# Patient Record
Sex: Female | Born: 2000 | Race: White | Hispanic: No | Marital: Single | Smoking: Never smoker
Health system: Southern US, Community
[De-identification: ages and names within clinical notes are randomized; demographics above are authoritative.]

## PROBLEM LIST (undated history)

## (undated) DIAGNOSIS — K5190 Ulcerative colitis, unspecified, without complications: Secondary | ICD-10-CM

## (undated) DIAGNOSIS — K529 Noninfective gastroenteritis and colitis, unspecified: Secondary | ICD-10-CM

## (undated) HISTORY — PX: UPPER GASTROINTESTINAL ENDOSCOPY: SHX188

## (undated) HISTORY — DX: Ulcerative colitis, unspecified, without complications: K51.90

## (undated) HISTORY — PX: COLONOSCOPY: SHX174

---
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
## 2023-11-03 ENCOUNTER — Telehealth: Payer: Self-pay | Admitting: Gastroenterology

## 2023-11-03 NOTE — Telephone Encounter (Signed)
 Unfortunately, I do not have any release on file indicating that I am able to speak with the patient's mother about her care. I have, however, attempted to reach patient at the phone number available in her chart. I get no answer and no voicemail is available. I will attempt to reach out again at a later time.

## 2023-11-03 NOTE — Telephone Encounter (Signed)
 Patient mother called and stated that she was having an ulcerative colitis flare up and was needing a appointment as soon as possible. Patient mother stated that her daughter was a patient of Dr. Doy Hutching over at [facility]. I advised patient that we dont do emergency appointments here and the soonest we have for her would be for May the 7 th. Patient mother stated that she would like Dr. Doy Hutching to please return her call either at [phone] or [phone]. Please advise.

## 2023-11-04 NOTE — Telephone Encounter (Signed)
 Again, attempted to reach Dana Graham by phone. No answer, no voicemail available.

## 2023-11-04 NOTE — Telephone Encounter (Signed)
 Spoke to Dana Graham. She states that she was a previous patient with Dr Doy Hutching over at [facility] (last seen 2022). Hx colitis.   She indicates that over the last 2 weeks, she has experienced diarrhea. Originally thought she had eaten some undercooked pork but after continued symptoms, saw PCP. Stool studies were completed and she was told everything was negative (CDiff, fecal leukocytes). Continues to have diarrhea upwards of 8+ times daily at this point. Denies any blood in stool. Does have nausea but no vomiting, +epigastric and periumbilical abdominal tenderness, no fevers. She states she did take some imodium today and was able to go to work but is aware that imodium may not be good for her to take. She does not currently take any medications for IBD. Patient requesting expedited appointment. First available appointment with extender is 11/13/23; first available [facility] appointment is 12/02/23)  Dr Doy Hutching, please advise.Marland KitchenMarland Kitchen

## 2023-11-05 NOTE — Telephone Encounter (Signed)
 I have spoken to Dana Graham and offered appointment with Dr Doy Hutching on 11/13/23 at 340 pm. Patient is in agreement with this. Appointment has been scheduled and patient advised to arrive at 320 pm for registration/paperwork.

## 2023-11-12 NOTE — Progress Notes (Addendum)
 [facility] Return Visit   Referring Provider No referring provider defined for this encounter.  Primary Care Provider Lynwood Dawley, Dana Graham  Patient Profile: Dana Graham is a 23 y.o. female who returns to the [facility] for follow-up of the problem(s) noted below.  Problem List: 1. Pan ulcerative colitis diagnosed 2014 2. History of C. Diff colitis 04/2014 3. History of anemia requiring iron infusion  4. 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 Diarrhea, fecal urgency, bloating   History of Present Illness   Dana Graham was previously followed by me at [facility].  She was last seen for colonoscopy 07/03/2022 Last clinic visit at [facility] was 12/26/2020   Current Graham Meds  Probiotic  Interval History  -- Dana Graham's last colonoscopy in 2023 showed Mayo 1 proctitis and Mayo 0 disease in the remainder of the colon; polyps removed were TA and inflammatory polyp -- Advised use of probiotic and Canasa suppositories  -- Dana Graham presents today to establish care at Dana Graham reporting a recent change in symptoms that she thinks is related to food she ate rather than UC flare -- Reports consuming [place] food 3 weeks ago and thought there was something wrong with pork Gyoza she ate -- Subsequently developed the onset of frequent, loose watery bowel movements with associated fecal urgency -no blood or mucus in stool -- Endorsed mild abdominal pain and cramping -- Was seen by PCP and reportedly had normal labs and stool studies -- Took a dose of Imodium and subsequently did not have a bowel movement for 2 days -- Is continuing on her current probiotic and use an expired version of VSL #3 she had in her refrigerator  -- Currently having 3-4 loose bowel movements a day -- Describes excessive gas and bloating that has been going on for quite some time -discussed celiac disease, nonceliac gluten sensitivity and  SIBO -- Stool frequency is sometimes exacerbated by eating certain foods - crab rangoons and possibly dairy -- Has cut back on alcohol ingestion to monitor frequency -- States that she had been consuming protein shakes over the last few months as well as creatine but has not consumed or shakes for the last 2 weeks  -- Denies fevers, chills, joint pain, skin rashes or oral ulcers  -- Weight, energy and appetite are stable  Last colonoscopy:  07/2022 - Mayo 1 proctitis and Mayo 0 disease in the remainder of the colon; polyps removed were TA and inflammatory polyp Last endoscopy: 09/2015 -gastric erythema  Last Abd CT/CTE/MRE:  CTE 12/2012 -moderate retained fecal material-no bowel wall thickening  Graham Review of Symptoms Significant for fecal urgency, diarrhea. Otherwise negative.  General Review of Systems  Review of systems is significant for the pertinent positives and negatives as listed per the HPI.  Full ROS is otherwise negative  Inflammatory Bowel Disease History  2014 - Bloody diarrhea; EGD/colonoscopy ([facility]) - Patchy gastric erythema; ? Patchy vs. Diffuse inflammation in colon, possible backwash ileitis in TI --> tx'd w/ prednisone + Delzicol          - Inadequate response to Colazal; improvement in symptoms on Apriso --> Family discontinued medication of their own volition 2017 - Hospitalized for flare; Colonoscopy -diffuse severe pancolitis, hgb 7.7; tx'd w/solumedrol/prednisone + Colazal - family refused IFX in favor of continuing mesalamine and lost to follow-up 2022 - Colonoscopy - Moderately active (Mayo Score 1,2) ulcerative colitis in the mid-rectum extending to the splenic flexure --> advised  resumption of 5-ASA with adjuvant probiotic and curcumin 2023 - Colonoscopy - Mayo 1 rectum, Mayo 0 rest of colon --> advised lowest continuing probiotic interval mesalamine suppositories   IBD Medication History Corticosteroids Mesalamine agents-Colazal, Apriso Probiotics  Past  Medical History   Past Medical History:  Diagnosis Date   C. difficile colitis 2015   Ulcerative colitis (HCC)    Panulcerative colitis diagnosed 2020     Past Surgical History   Past Surgical History:  Procedure Laterality Date   COLONOSCOPY     UPPER GASTROINTESTINAL ENDOSCOPY       Allergies and Medications   Allergies  Allergen Reactions   Vitamin K And Related Shortness Of Breath    No outpatient medications have been marked as taking for the 11/13/23 encounter (Office Visit) with Dana Glazier, Dana Graham.     Family History   Family History  Problem Relation Age of Onset   Ulcerative colitis Father     Social History   Social History   Tobacco Use   Smoking status: Never  Substance Use Topics   Alcohol use: No   [name] reports that she has never smoked. She does not have any smokeless tobacco history on file. She reports that she does not drink alcohol. No history on file for drug use.  Social History   Social History Narrative   [name] resides in [place] with her boyfriend   Currently employed as a Arts development officer at [employer]   Also works as a Engineer, maintenance Signs and Physical Examination   Vitals:   11/13/23 1541  BP: 110/70  Pulse: 83   Body mass index is 25.31 kg/m. Weight: 164 lb (74.4 kg)  General: Well developed, well nourished, no acute distress Head: Normocephalic and atraumatic Eyes: Sclerae anicteric, EOMI Lungs: Clear throughout to auscultation Heart: Regular rate and rhythm; No murmurs, rubs or bruits Abdomen: Soft, non tender and non distended. No masses, hepatosplenomegaly or hernias noted. Normal Bowel sounds Rectal: Deferred Musculoskeletal: Symmetrical with no gross deformities  Pulses:  Normal pulses noted  Review of Data  The following data was reviewed at the time of this encounter:  Laboratory Studies   Labs 10/30/2023 WBC 7.3, Hgb 12.7, HCT 38.5, PLT 277 CMP normal Leukocytes absent C. difficile negative  IBD  Labs  Prebiologic Labs   Therapeutic Drug Monitoring  Thiopurine metabolite levels:  Date:                6-TGN       6-MMP  Biologic level and antibodies:  Fecal Calprotectin 05/2018   192 10/2020   978  Imaging Studies  CTE 12/2012 Moderate retained fecal material. No bowel wall thickening.   Graham Procedures and Studies   Colonoscopy 07/2022 - Mild (Mayo Score 1) proctitis ulcerative colitis.  - Inactive (Mayo Score 0) ulcerative colitis in remainder of colon.  - One 3 mm polyp in the transverse colon, removed with a cold biopsy forceps. Resected and retrieved. - One 4 mm polyp in the rectum, removed with a jumbo cold forceps. Resected and retrieved. - The examined portion of the ileum was normal. - The distal rectum and anal verge are normal on  retroflexion view.   Path: Normal right and left colon biopsies, tubular adenoma, benign inflammatory polyp, focal chronic inactive colitis in rectosigmoid colon  Colonoscopy 05/2021 - Moderately active (Mayo Score 1,2) ulcerative colitis in the mid-rectum extending to the splenic  flexure. - One 4 mm polyp in the  rectum, removed with a cold  biopsy forceps. Resected and retrieved. - Normal mucosa in the distal rectum, in the  transverse colon, in the ascending colon and in the  cecum. - The examined portion of the ileum was normal. - The distal rectum and anal verge are normal on  retroflexion view. - Biopsies for surveillance were taken from the  ascending colon, transverse colon, descending colon,  sigmoid colon and rectum.   Path: Mild active colitis throughout the entire colon, polyp-colonic mucosa with low-grade dysplasia  EGD/colonoscopy 09/2015 EGD - erythema in the stomach Colonoscopy - moderate to severe diffuse pancolitis.  Biopsies of the stomach showed chronic active gastritis that was H. pylori negative. There was focal active inflammation in the duodenum. Terminal ileal biopsies were negative. Focal active colitis was  seen throughout the colon and was CMV negative.   EGD/Colonoscopy 11/2012 EGD - Normal esophagus , patchy mildly erythematous mucosa in the stomach, normal duodenum. Biopsies unavailable. Colonoscopy - patchy mild inflammation in ascending colon including a likely peri-appendiceal red patch, diffuse severe inflammation in the descending colon. The endoscopic pictures presented in clinic appear to show diffuse pan colitis. There is notation of a normal terminal ileum with the exception of some mild erythema suspicious for backwash ileitis. Biopsies unavailable.   Clinical Impression  It is my clinical impression that Dana Graham is a 23 y.o. female with;  1. Pan ulcerative colitis diagnosed 2014 2. History of C. Diff colitis 04/2014 3. History of anemia requiring iron infusion  4. 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 Diarrhea, fecal urgency, bloating  Dana Graham was diagnosed with pan ulcerative colitis in 2014 after presenting with symptoms of bloody diarrhea. She has been managed with corticosteroids and oral mesalamine throughout her course. Her history is noteworthy for an episode of C. difficile colitis in 2015 and hospitalization for a significant flare of her ulcerative colitis in 2016. There was consideration of using rescue infliximab during her hospitalization but her family declined escalation to biologic therapy. Dana Graham ultimately did garner a clinical symptomatic response to corticosteroids and oral mesalamine which she subsequently discontinued.   Dana Graham was off IBD medical therapy and reported crescendoing symptoms of a flare beginning in March 2022. Laboratory studies were noteworthy for an elevated sedimentation rate and fecal calprotectin 978. Restaging colonoscopy 05/2021 demonstrated moderately active (Mayo 1,2) ulcerative colitis in the mid rectum extending to the splenic flexure.  I advised resumption of oral mesalamine in conjunction with  probiotic and curcumin.  Dana Graham utilize these treatments and then subsequently discontinued mesalamine once again.  Restaging colonoscopy 07/2022 showed general remission of disease with only Mayo 1 inflammation in the rectum and the remainder of her colon being normal.  It is noteworthy that she had polyps on colonoscopies in 2022 in 2023 months.  She presents to the office today to establish care reporting that she has had diarrhea and fecal urgency in recent weeks after eating what she felt was a contaminated Chinese meal.  Stool studies negative for enteric pathogens at [facility].  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  She does not feel that her current constellation of symptoms are consistent with UC flare.  I have suggested that she continue on probiotics. Reports some longstanding symptoms of bloating which could also be consistent with IBS, celiac disease, nonceliac gluten sensitivity or SIBO-recommended celiac testing today and breath testing for SIBO  Dana Graham is coming  due for a surveillance colonoscopy given her history of pan ulcerative colitis and tubular adenomas.  She requested that we schedule her procedure for summer 2025.  She has had difficulty with bowel preps in the past.  Recommended use of Suprep or modified magnesium citrate bowel prep.  Plan  Labs today: Celiac panel, thyroid profile SIBO breath test ordered Schedule restaging and surveillance colonoscopy summer 2025 - Suprep or mag citrate - four 6 oz doses over 24 hours + 2 Ex lax or Dulcolax Continue probiotics Information provided regarding low FODMAP diet Monitor weight and anthropometrics   IBD Health Maintenance  Vaccinations Influenza: PCV13: PPSV23: COVID19: HAV/HBV: Shingles: HPV:  DEXA PRN  Pap Smear   Eye Exam PRN  Skin Exam N/A  Surveillance Colonoscopy Due 2025 - request placed  Tobacco Use   Depression Screen     Planned Follow  Up 4-6 months  The patient or caregiver verbalized understanding of the material covered, with no barriers to understanding. All questions were answered. Patient or caregiver is agreeable with the plan outlined above.    It was a pleasure to see Dana Graham.  If you have any questions or concerns regarding this evaluation, do not hesitate to contact me.  Dana Beach, Dana Graham [facility]   I spent total of 40 minutes in both face-to-face and non-face-to-face activities, including 30 minute discussion about current symptoms and plan in addition to previous record review from [facility] (last clinic note 05/03/2021 and endoscopic procedures) and ordering labs/studies for the visit on the date of this encounter.

## 2023-11-13 ENCOUNTER — Encounter: Payer: Self-pay | Admitting: Pediatrics

## 2023-11-13 ENCOUNTER — Ambulatory Visit: Admitting: Pediatrics

## 2023-11-13 ENCOUNTER — Other Ambulatory Visit (INDEPENDENT_AMBULATORY_CARE_PROVIDER_SITE_OTHER)

## 2023-11-13 VITALS — BP 110/70 | HR 83 | Ht 67.5 in | Wt 164.0 lb

## 2023-11-13 DIAGNOSIS — K5100 Ulcerative (chronic) pancolitis without complications: Secondary | ICD-10-CM

## 2023-11-13 DIAGNOSIS — R197 Diarrhea, unspecified: Secondary | ICD-10-CM | POA: Diagnosis not present

## 2023-11-13 DIAGNOSIS — R140 Abdominal distension (gaseous): Secondary | ICD-10-CM

## 2023-11-13 DIAGNOSIS — Z8619 Personal history of other infectious and parasitic diseases: Secondary | ICD-10-CM

## 2023-11-13 DIAGNOSIS — R152 Fecal urgency: Secondary | ICD-10-CM | POA: Diagnosis not present

## 2023-11-13 DIAGNOSIS — Z860101 Personal history of adenomatous and serrated colon polyps: Secondary | ICD-10-CM

## 2023-11-13 DIAGNOSIS — Z862 Personal history of diseases of the blood and blood-forming organs and certain disorders involving the immune mechanism: Secondary | ICD-10-CM

## 2023-11-13 DIAGNOSIS — Z860100 Personal history of colon polyps, unspecified: Secondary | ICD-10-CM

## 2023-11-13 NOTE — Patient Instructions (Signed)
 Your provider has requested that you go to the basement level for lab work before leaving today. Press "B" on the elevator. The lab is located at the first door on the left as you exit the elevator.   Due to recent changes in healthcare laws, you may see the results of your imaging and laboratory studies on MyChart before your provider has had a chance to review them.  We understand that in some cases there may be results that are confusing or concerning to you. Not all laboratory results come back in the same time frame and the provider may be waiting for multiple results in order to interpret others.  Please give [place] 48 hours in order for your provider to thoroughly review all the results before contacting the office for clarification of your results.    We are giving you a Low-FODMAP diet handout today. FODMAPs are short-chain carbohydrates (sugars) that are highly fermentable, which means that they go through chemical changes in the GI system, and are poorly absorbed during digestion. When FODMAPs reach the colon (large intestine), bacteria ferment these sugars, turning them into gas and chemicals. This stretches the walls of the colon, causing abdominal bloating, distension, cramping, pain, and/or changes in bowel habits in many patients with IBS. FODMAPs are not unhealthy or harmful, but may exacerbate GI symptoms in those with sensitive GI tracts.   You have been given a testing kit to check for small intestine bacterial overgrowth (SIBO) which is completed by a company named [facility]. Make sure to return your test in the mail using the return mailing label given to you along with the kit. The test order, your demographic and insurance information have all already been sent to the company. [facility] will collect an upfront charge of $99.74 for commercial insurance plans and $209.74 if you are paying cash. Make sure to discuss with [facility] PRIOR to having the test to see if they  have gotten information from your insurance company as to how much your testing will cost out of pocket, if any. Please contact [facility] at phone number [phone] to get instructions regarding how to perform the test as our office is unable to give specific testing instructions.   You have been scheduled for a colonoscopy. Please follow written instructions given to you at your visit today.   If you use inhalers (even only as needed), please bring them with you on the day of your procedure.  DO NOT TAKE 7 DAYS PRIOR TO TEST- Trulicity (dulaglutide) Ozempic, Wegovy (semaglutide) Mounjaro (tirzepatide) Bydureon Bcise (exanatide extended release)  DO NOT TAKE 1 DAY PRIOR TO YOUR TEST Rybelsus (semaglutide) Adlyxin (lixisenatide) Victoza (liraglutide) Byetta (exanatide) ___________________________________________________________________________    Thank you for entrusting me with your care and for choosing [facility], Dr. Maren Beach   _______________________________________________________  If your blood pressure at your visit was 140/90 or greater, please contact your primary care physician to follow up on this.  _______________________________________________________  If you are age 23 or older, your body mass index should be between 23-30. Your Body mass index is 25.31 kg/m. If this is out of the aforementioned range listed, please consider follow up with your Primary Care Provider.  If you are age 87 or younger, your body mass index should be between 19-25. Your Body mass index is 25.31 kg/m. If this is out of the aformentioned range listed, please consider follow up with your Primary Care Provider.   ________________________________________________________  The  GI providers would like to encourage  you to use [facility] to communicate with providers for non-urgent requests or questions.  Due to long hold times on the telephone, sending your provider  a message by [facility] may be a faster and more efficient way to get a response.  Please allow 48 business hours for a response.  Please remember that this is for non-urgent requests.  _______________________________________________________

## 2023-11-15 ENCOUNTER — Encounter: Payer: Self-pay | Admitting: Pediatrics

## 2023-11-16 LAB — TISSUE TRANSGLUTAMINASE ABS,IGG,IGA
(tTG) Ab, IgA: <1 U/mL
(tTG) Ab, IgG: <1 U/mL

## 2023-11-16 LAB — IGA: Immunoglobulin A: 265 mg/dL (ref 47–310)

## 2023-11-16 LAB — TSH: TSH: 2.1 u[IU]/mL (ref 0.35–5.50)

## 2024-01-27 ENCOUNTER — Encounter: Payer: Self-pay | Admitting: Pediatrics

## 2024-02-05 ENCOUNTER — Encounter: Admitting: Pediatrics

## 2024-02-29 ENCOUNTER — Encounter: Payer: Self-pay | Admitting: Pediatrics

## 2024-02-29 ENCOUNTER — Other Ambulatory Visit: Payer: Self-pay

## 2024-03-01 ENCOUNTER — Telehealth: Payer: Self-pay | Admitting: Pediatrics

## 2024-03-01 NOTE — Telephone Encounter (Signed)
 Patient called and stated that she would like to have an estimate of how much her colonoscopy will be. Patient is requesting a call back. Please advise.

## 2024-03-02 ENCOUNTER — Ambulatory Visit (AMBULATORY_SURGERY_CENTER)

## 2024-03-02 VITALS — Ht 67.5 in | Wt 151.0 lb

## 2024-03-02 DIAGNOSIS — Z860100 Personal history of colon polyps, unspecified: Secondary | ICD-10-CM

## 2024-03-02 DIAGNOSIS — K51919 Ulcerative colitis, unspecified with unspecified complications: Secondary | ICD-10-CM

## 2024-03-02 NOTE — Progress Notes (Signed)
 No egg or soy allergy known to patient  No issues known to pt with past sedation with any surgeries or procedures Patient denies ever being told they had issues or difficulty with intubation  No FH of Malignant Hyperthermia Pt is not on diet pills Pt is not on  home 02  Pt is not on blood thinners  Pt denies issues with constipation  No A fib or A flutter Have any cardiac testing pending--No Pt can ambulate  Pt denies use of chewing tobacco Discussed diabetic I weight loss medication holds Discussed NSAID holds Checked BMI Pt instructed to use [employer] or GoodRx for a price reduction on prep  Patient's chart reviewed.  Pre visit completed

## 2024-03-03 ENCOUNTER — Telehealth: Payer: Self-pay

## 2024-03-03 ENCOUNTER — Other Ambulatory Visit: Payer: Self-pay

## 2024-03-03 ENCOUNTER — Telehealth: Payer: Self-pay | Admitting: Pediatrics

## 2024-03-03 DIAGNOSIS — Z860100 Personal history of colon polyps, unspecified: Secondary | ICD-10-CM

## 2024-03-03 MED ORDER — PROMETHAZINE HCL 25 MG PO TABS
12.5000 mg | ORAL_TABLET | Freq: Four times a day (QID) | ORAL | 0 refills | Status: AC | PRN
Start: 2024-03-03 — End: ?

## 2024-03-03 NOTE — Telephone Encounter (Signed)
 Patient states she has a fever and is not feeling well. Unable to eat. Procedure for 7/7. Please advise.   Thank you

## 2024-03-03 NOTE — Telephone Encounter (Signed)
 Hey Dr. Suzann,   I just spoke with Marvel. Pt colonoscopy scheduled for Monday 7/7. She is super concerned about prepping due to extreme n/v with drinking her prep. Offered to send in zofran but per pt this does not provide her any relief. Is there an alternate you would be ok with me sending in for her.   Thank you.

## 2024-03-03 NOTE — Telephone Encounter (Signed)
 Spoke with pt. She is more so anxious about her prep than  anything. Pt states she feels fine now. Pt understands she needs to be fever free for 24 hours or she will need to be r/s. Pt to call back if symptoms return.

## 2024-03-05 NOTE — Progress Notes (Unsigned)
 [facility] History and Physical   Primary Care Physician:  Miriam Rocky Shams, MD   Reason for Procedure:  Pan ulcerative colitis diagnosed 2014, diarrhea, abdominal pain, hematochezia, history of adenomatous colon polyps  Plan:    Colonoscopy     HPI: Dana Graham is a 23 y.o. female undergoing colonoscopy for disease activity assessment of pan ulcerative colitis in 2014.  Patient has recently experienced symptoms of diarrhea, abdominal pain and hematochezia.  Not currently on any standard IBD therapy other than probiotics.  Last colonoscopy was performed in 2023 demonstrating Mayo 0 colitis as well as a tubular adenoma.  There is a pertinent family history of ulcerative colitis in the patient's father.  No family history of colorectal cancer.   Past Medical History:  Diagnosis Date   C. difficile colitis 2015   Ulcerative colitis (HCC)    Panulcerative colitis diagnosed 2020    Past Surgical History:  Procedure Laterality Date   COLONOSCOPY     UPPER GASTROINTESTINAL ENDOSCOPY      Prior to Admission medications   Medication Sig Start Date End Date Taking? Authorizing Provider  acetaminophen (TYLENOL) 325 MG tablet Take 650 mg by mouth. 12/27/20   [provider]  balsalazide (COLAZAL) 750 MG capsule Take 2,250 mg by mouth. 12/26/20   [provider]  cetirizine (ZYRTEC) 10 MG tablet Take 10 mg by mouth. Patient not taking: Reported on 03/02/2024 12/27/20   [provider]  EMGALITY 120 MG/ML SOSY Inject 1 mL into the skin. 05/20/23   [provider]  escitalopram (LEXAPRO) 10 MG tablet Take 1 tablet by mouth daily. 11/30/23   [provider]  fluticasone (FLONASE) 50 MCG/ACT nasal spray Place 2 sprays into the nose. Patient not taking: Reported on 03/02/2024 12/27/20   [provider]  hydrOXYzine (ATARAX) 25 MG tablet Take 1 tablet by mouth 3 (three) times daily as needed. Patient not taking: Reported on 03/02/2024  11/26/22   [provider]  levonorgestrel (MIRENA, 52 MG,) 20 MCG/DAY IUD 1 each by Intrauterine route. 12/27/20   [provider]  Multiple Vitamin (MULTI-VITAMIN) tablet Take 1 tablet by mouth daily. Patient not taking: Reported on 03/02/2024    [provider]  Multiple Vitamin (MULTI-VITAMIN) tablet Take 1 tablet by mouth daily. 12/27/20   [provider]  promethazine  (PHENERGAN ) 25 MG tablet Take 0.5 tablets (12.5 mg total) by mouth every 6 (six) hours as needed for nausea or vomiting. 03/03/24   Suzann Inocente HERO, MD  traZODone (DESYREL) 100 MG tablet Take 200 mg by mouth. 11/30/23   [provider]    Current Outpatient Medications  Medication Sig Dispense Refill   acetaminophen (TYLENOL) 325 MG tablet Take 650 mg by mouth.     escitalopram (LEXAPRO) 10 MG tablet Take 1 tablet by mouth daily.     levonorgestrel (MIRENA, 52 MG,) 20 MCG/DAY IUD 1 each by Intrauterine route.     promethazine  (PHENERGAN ) 25 MG tablet Take 0.5 tablets (12.5 mg total) by mouth every 6 (six) hours as needed for nausea or vomiting. 10 tablet 0   traZODone (DESYREL) 100 MG tablet Take 200 mg by mouth.     balsalazide (COLAZAL) 750 MG capsule Take 2,250 mg by mouth.     cetirizine (ZYRTEC) 10 MG tablet Take 10 mg by mouth. (Patient not taking: No sig reported)     EMGALITY 120 MG/ML SOSY Inject 1 mL into the skin.     fluticasone (FLONASE) 50 MCG/ACT nasal spray  Place 2 sprays into the nose. (Patient not taking: No sig reported)     hydrOXYzine (ATARAX) 25 MG tablet Take 1 tablet by mouth 3 (three) times daily as needed. (Patient not taking: No sig reported)     Multiple Vitamin (MULTI-VITAMIN) tablet Take 1 tablet by mouth daily. (Patient not taking: No sig reported)     Multiple Vitamin (MULTI-VITAMIN) tablet Take 1 tablet by mouth daily.     Current Facility-Administered Medications  Medication Dose Route Frequency Provider Last Rate Last Admin   0.9 %  sodium chloride   infusion  500 mL Intravenous Once Kynlei Piontek, Inocente HERO, MD        Allergies as of 03/07/2024 - Review Complete 03/07/2024  Allergen Reaction Noted   Vitamin k and related Shortness Of Breath 09/15/2015    Family History  Problem Relation Age of Onset   Ulcerative colitis Father    Colon cancer Neg Hx    Rectal cancer Neg Hx    Stomach cancer Neg Hx    Esophageal cancer Neg Hx     Social History   Socioeconomic History   Marital status: Single    Spouse name: Not on file   Number of children: Not on file   Years of education: Not on file   Highest education level: Not on file  Occupational History   Not on file  Tobacco Use   Smoking status: Never   Smokeless tobacco: Not on file  Vaping Use   Vaping status: Never Used  Substance and Sexual Activity   Alcohol use: No   Drug use: Never   Sexual activity: Not on file  Other Topics Concern   Not on file  Social History Narrative   Dana Graham resides in [place] with her boyfriend   Currently employed as a Arts development officer at [employer]   Also works as a Surveyor, minerals [employer]rivers of Corporate investment banker Strain: Low Risk  (11/30/2023)   Received from [facility]   Overall Financial Resource Strain (CARDIA)    Difficulty of Paying Living Expenses: Not hard at all  Food Insecurity: No Food Insecurity (11/30/2023)   Received from [facility]   Hunger Vital Sign    Within the past 12 months, you worried that your food would run out before you got the money to buy more.: Never true    Within the past 12 months, the food you bought just didn't last and you didn't have money to get more.: Never true  Transportation Needs: No Transportation Needs (11/30/2023)   Received from [facility] Transportation    In the past 12 months, has lack of transportation kept you from medical appointments or from getting medications?: No    Lack of Transportation (Non-Medical): No   Physical Activity: Not on file  Stress: Not on file  Social Connections: Unknown (01/14/2022)   Received from [facility]   Social Network    Social Network: Not on file  Intimate Partner Violence: Unknown (12/06/2021)   Received from [facility]   HITS    Physically Hurt: Not on file    Insult or Talk Down To: Not on file    Threaten Physical Harm: Not on file    Scream or Curse: Not on file    Review of Systems:  All other review of systems negative except as mentioned in the HPI.  Physical Exam: Vital signs BP 112/74   Pulse 73  Temp 98.1 F (36.7 C)   Ht 5' 7 (1.702 m)   Wt 151 lb (68.5 kg)   SpO2 98%   BMI 23.65 kg/m   General:   Alert,  Well-developed, well-nourished, pleasant and cooperative in NAD Airway:  Mallampati 1 Lungs:  Clear throughout to auscultation.   Heart:  Regular rate and rhythm; no murmurs, clicks, rubs,  or gallops. Abdomen:  Soft, nontender and nondistended. Normal bowel sounds.   Neuro/Psych:  Normal mood and affect. A and O x 3  Inocente Hausen, MD [facility]

## 2024-03-07 ENCOUNTER — Telehealth: Payer: Self-pay | Admitting: *Deleted

## 2024-03-07 ENCOUNTER — Telehealth: Payer: Self-pay | Admitting: Pediatrics

## 2024-03-07 ENCOUNTER — Encounter: Payer: Self-pay | Admitting: Pediatrics

## 2024-03-07 ENCOUNTER — Ambulatory Visit (AMBULATORY_SURGERY_CENTER): Admitting: Pediatrics

## 2024-03-07 VITALS — BP 107/67 | HR 76 | Temp 98.1°F | Resp 17 | Ht 67.0 in | Wt 151.0 lb

## 2024-03-07 DIAGNOSIS — K5100 Ulcerative (chronic) pancolitis without complications: Secondary | ICD-10-CM | POA: Diagnosis present

## 2024-03-07 DIAGNOSIS — K5150 Left sided colitis without complications: Secondary | ICD-10-CM | POA: Diagnosis not present

## 2024-03-07 DIAGNOSIS — R197 Diarrhea, unspecified: Secondary | ICD-10-CM

## 2024-03-07 DIAGNOSIS — K51919 Ulcerative colitis, unspecified with unspecified complications: Secondary | ICD-10-CM

## 2024-03-07 DIAGNOSIS — K529 Noninfective gastroenteritis and colitis, unspecified: Secondary | ICD-10-CM | POA: Diagnosis not present

## 2024-03-07 DIAGNOSIS — R109 Unspecified abdominal pain: Secondary | ICD-10-CM

## 2024-03-07 DIAGNOSIS — K921 Melena: Secondary | ICD-10-CM

## 2024-03-07 MED ORDER — SODIUM CHLORIDE 0.9 % IV SOLN: 500.0000 mL | Freq: Once | INTRAVENOUS | Status: DC

## 2024-03-07 MED ORDER — MESALAMINE ER 0.375 G PO CP24
1.5000 g | ORAL_CAPSULE | Freq: Every day | ORAL | 2 refills | Status: AC
Start: 2024-03-07 — End: ?

## 2024-03-07 MED ORDER — PREDNISONE 10 MG PO TABS: 40.0000 mg | ORAL_TABLET | Freq: Every day | ORAL | 1 refills | Status: AC

## 2024-03-07 NOTE — Progress Notes (Signed)
 Sedate, gd SR, tolerated procedure well, VSS, report to RN

## 2024-03-07 NOTE — Progress Notes (Signed)
 Pt's states no medical or surgical changes since previsit or office visit.

## 2024-03-07 NOTE — Op Note (Signed)
 [facility] Patient Name: Dana Graham Procedure Date: 03/07/2024 11:04 AM MRN: [id number] Endoscopist: Inocente Hausen , MD, [id number] Age: 23 Referring MD:  Date of Birth: 2000-12-14 Gender: Female Account #: [id number] Procedure:                Colonoscopy Indications:              Last colonoscopy: 2022, Abdominal pain, Diarrhea,                            Hematochezia, Follow-up for history of adenomatous                            polyps in the colon, Disease activity assessment of                            chronic ulcerative pancolitis, patient has been                            taking a probiotic but is not on standard IBD                            medical therapy at this time. Last colonoscopy                            performed in 2023 confirmed endoscopic remission of                            disease. Medicines:                Monitored Anesthesia Care Procedure:                Pre-Anesthesia Assessment:                           - Prior to the procedure, a History and Physical                            was performed, and patient medications and                            allergies were reviewed. The patient's tolerance of                            previous anesthesia was also reviewed. The risks                            and benefits of the procedure and the sedation                            options and risks were discussed with the patient.                            All questions were answered, and informed consent  was obtained. Prior Anticoagulants: The patient has                            taken no anticoagulant or antiplatelet agents. ASA                            Grade Assessment: II - A patient with mild systemic                            disease. After reviewing the risks and benefits,                            the patient was deemed in satisfactory condition to                            undergo the procedure.                            After obtaining informed consent, the colonoscope                            was passed under direct vision. Throughout the                            procedure, the patient's blood pressure, pulse, and                            oxygen saturations were monitored continuously. The                            Olympus Scope SN: [id number] was introduced through                            the anus and advanced to the terminal ileum. The                            colonoscopy was performed without difficulty. The                            patient tolerated the procedure well. The quality                            of the bowel preparation was good. The terminal                            ileum, ileocecal valve, appendiceal orifice, and                            rectum were photographed. Scope In: 11:19:29 AM Scope Out: 11:34:06 AM Scope Withdrawal Time: 0 hours 10 minutes 16 seconds  Total Procedure Duration: 0 hours 14 minutes 37 seconds  Findings:                 The perianal and digital rectal examinations were  normal. Pertinent negatives include normal                            sphincter tone and no palpable rectal lesions.                           Inflammation was found in a continuous and                            circumferential pattern from the anus to the cecum.                            Inflammation in the cecum was slightly patchy but                            diffuse in the remainder of the colon. This was                            graded as Mayo Score 2 (moderate, with marked                            erythema, absent vascular pattern, friability,                            erosions). Biopsies were taken with a cold forceps                            for histology and to rule out CMV.                           The terminal ileum appeared normal. Biopsies were                            taken with a cold forceps for  histology.                           Retroflexion in the rectum was deferred due to                            degree of inflammation. Complications:            No immediate complications. Estimated blood loss:                            Minimal. Estimated Blood Loss:     Estimated blood loss was minimal. Impression:               - Moderately active (Mayo Score 2) pancolitis                            ulcerative colitis. Biopsied.                           - The examined portion of the ileum was normal.  Biopsied. Recommendation:           - Discharge patient to home (ambulatory).                           - Await pathology results.                           - Start prednisone  40 mg orally daily with taper to                            be dictated by clinical response                           - Start Apriso  1.5 g p.o. daily                           - The findings and recommendations were discussed                            with the designated responsible adult.                           - Return to GI clinic in 4-6 weeks with Dr. Suzann                            or APP.                           - Patient has a contact number available for                            emergencies. The signs and symptoms of potential                            delayed complications were discussed with the                            patient. Return to normal activities tomorrow.                            Written discharge instructions were provided to the                            patient. [name], MD 03/07/2024 11:42:34 AM This report has been signed electronically.

## 2024-03-07 NOTE — Telephone Encounter (Signed)
 Called the pharmacy. Left a detailed message about the Mesalamine  rx dose and frequency. Called the patient to let her know that we are working on the Rx and will notify her when it is ready. PT verbalized understanding.

## 2024-03-07 NOTE — Telephone Encounter (Signed)
 PT is calling to advise that [facility] did not receive the prescription for mesalamine . Please advise.

## 2024-03-07 NOTE — Telephone Encounter (Signed)
 Returned the patient's phone call. Thee Rx was sent to the pharmacy electronically however they did not have the medication in stock. It will be available after 2 pm tomorrow. Pt verbalized understanding.

## 2024-03-07 NOTE — Patient Instructions (Addendum)
 START taking Apriso  1.5 g (4 tablets) daily.  Medication has been sent to your preferred pharmacy.  START taking prednisone  40 mg (4 tablets) daily with taper to be dictated by clinical response. Medication has been sent to your preferred pharmacy.  Please see scheduled follow up appointment.  YOU HAD AN ENDOSCOPIC PROCEDURE TODAY AT THE [facility]:   Refer to the procedure report that was given to you for any specific questions about what was found during the examination.  If the procedure report does not answer your questions, please call your gastroenterologist to clarify.  If you requested that your care partner not be given the details of your procedure findings, then the procedure report has been included in a sealed envelope for you to review at your convenience later.  YOU SHOULD EXPECT: Some feelings of bloating in the abdomen. Passage of more gas than usual.  Walking can help get rid of the air that was put into your GI tract during the procedure and reduce the bloating. If you had a lower endoscopy (such as a colonoscopy or flexible sigmoidoscopy) you may notice spotting of blood in your stool or on the toilet paper. If you underwent a bowel prep for your procedure, you may not have a normal bowel movement for a few days.  Please Note:  You might notice some irritation and congestion in your nose or some drainage.  This is from the oxygen used during your procedure.  There is no need for concern and it should clear up in a day or so.  SYMPTOMS TO REPORT IMMEDIATELY:  Following lower endoscopy (colonoscopy or flexible sigmoidoscopy):  Excessive amounts of blood in the stool  Significant tenderness or worsening of abdominal pains  Swelling of the abdomen that is new, acute  Fever of 100F or higher  For urgent or emergent issues, a gastroenterologist can be reached at any hour by calling [phone]. Do not use MyChart messaging for urgent concerns.    DIET:  We do  recommend a small meal at first, but then you may proceed to your regular diet.  Drink plenty of fluids but you should avoid alcoholic beverages for 24 hours.  ACTIVITY:  You should plan to take it easy for the rest of today and you should NOT DRIVE or use heavy machinery until tomorrow (because of the sedation medicines used during the test).    FOLLOW UP: Our staff will call the number listed on your records the next business day following your procedure.  We will call around 7:15- 8:00 am to check on you and address any questions or concerns that you may have regarding the information given to you following your procedure. If we do not reach you, we will leave a message.     If any biopsies were taken you will be contacted by phone or by letter within the next 1-3 weeks.  Please call us  at [phone] if you have not heard about the biopsies in 3 weeks.    SIGNATURES/CONFIDENTIALITY: You and/or your care partner have signed paperwork which will be entered into your electronic medical record.  These signatures attest to the fact that that the information above on your After Visit Summary has been reviewed and is understood.  Full responsibility of the confidentiality of this discharge information lies with you and/or your care-partner.

## 2024-03-07 NOTE — Progress Notes (Signed)
 Called to room to assist during endoscopic procedure.  Patient ID and intended procedure confirmed with present staff. Received instructions for my participation in the procedure from the performing physician.

## 2024-03-08 ENCOUNTER — Telehealth: Payer: Self-pay

## 2024-03-08 NOTE — Telephone Encounter (Signed)
 Left message

## 2024-03-09 ENCOUNTER — Ambulatory Visit: Payer: Self-pay | Admitting: Pediatrics

## 2024-03-09 LAB — SURGICAL PATHOLOGY

## 2024-03-14 ENCOUNTER — Encounter: Payer: Self-pay | Admitting: Pediatrics

## 2024-04-21 ENCOUNTER — Ambulatory Visit: Admitting: Physician Assistant
